# Patient Record
Sex: Female | Born: 1995 | Race: White | Hispanic: No | Marital: Single | Smoking: Never smoker
Health system: Southern US, Community
[De-identification: ages and names within clinical notes are randomized; demographics above are authoritative.]

## PROBLEM LIST (undated history)

## (undated) ENCOUNTER — Ambulatory Visit (HOSPITAL_COMMUNITY): Admission: EM | Payer: Self-pay | Source: Home / Self Care

## (undated) DIAGNOSIS — F32A Depression, unspecified: Secondary | ICD-10-CM

## (undated) DIAGNOSIS — E669 Obesity, unspecified: Secondary | ICD-10-CM

## (undated) DIAGNOSIS — F419 Anxiety disorder, unspecified: Secondary | ICD-10-CM

## (undated) DIAGNOSIS — T7840XA Allergy, unspecified, initial encounter: Secondary | ICD-10-CM

## (undated) DIAGNOSIS — K219 Gastro-esophageal reflux disease without esophagitis: Secondary | ICD-10-CM

## (undated) DIAGNOSIS — F329 Major depressive disorder, single episode, unspecified: Secondary | ICD-10-CM

## (undated) DIAGNOSIS — E282 Polycystic ovarian syndrome: Secondary | ICD-10-CM

## (undated) HISTORY — DX: Gastro-esophageal reflux disease without esophagitis: K21.9

## (undated) HISTORY — DX: Obesity, unspecified: E66.9

## (undated) HISTORY — DX: Anxiety disorder, unspecified: F41.9

## (undated) HISTORY — DX: Allergy, unspecified, initial encounter: T78.40XA

## (undated) HISTORY — DX: Polycystic ovarian syndrome: E28.2

## (undated) HISTORY — DX: Major depressive disorder, single episode, unspecified: F32.9

## (undated) HISTORY — DX: Depression, unspecified: F32.A

---
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
## 2013-01-26 ENCOUNTER — Other Ambulatory Visit (HOSPITAL_COMMUNITY): Payer: Self-pay | Admitting: Physician Assistant

## 2013-03-01 IMAGING — US US ABDOMEN COMPLETE
1 series · 13 of 25 positions shown · non-contrast
Comparison: None.

CLINICAL DATA: Abdominal pain for a few weeks.  Diabetes

COMPLETE ABDOMINAL ULTRASOUND

[Series 1: us abdomen complete · 0.26mm/px · 13 of 58 slices shown]
[im 1/58]
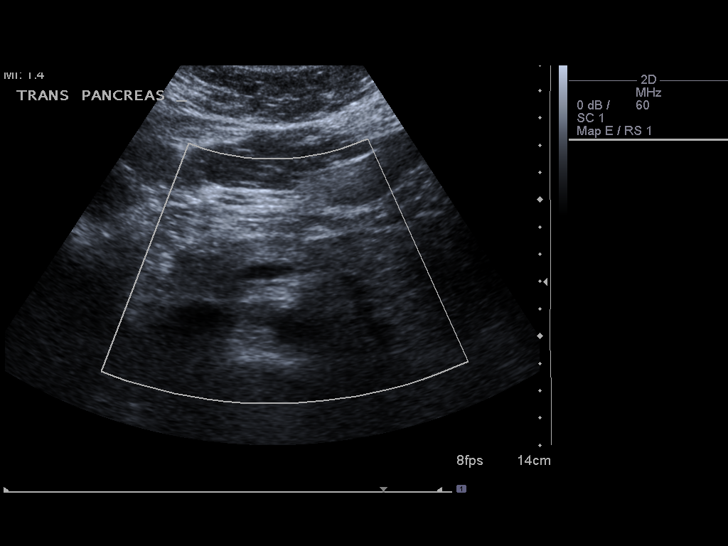
[im 5/58]
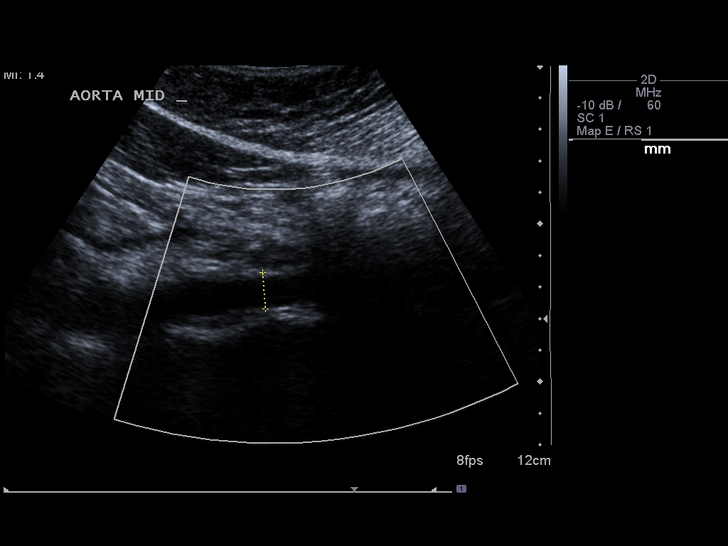
[im 10/58]
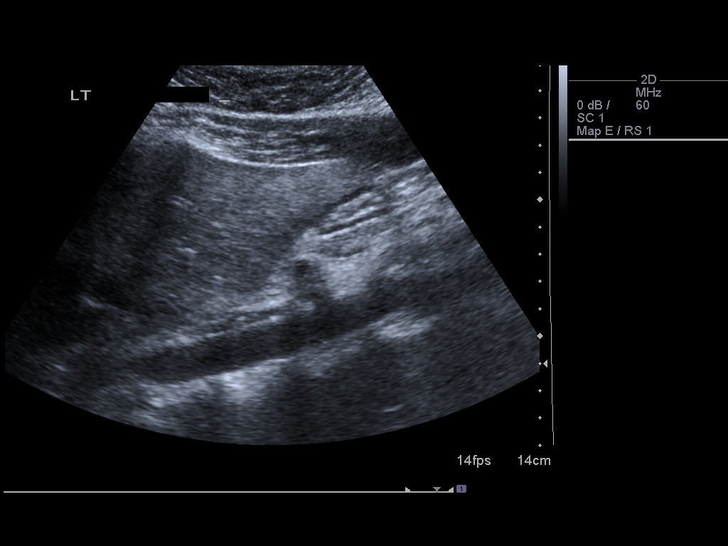
[im 15/58]
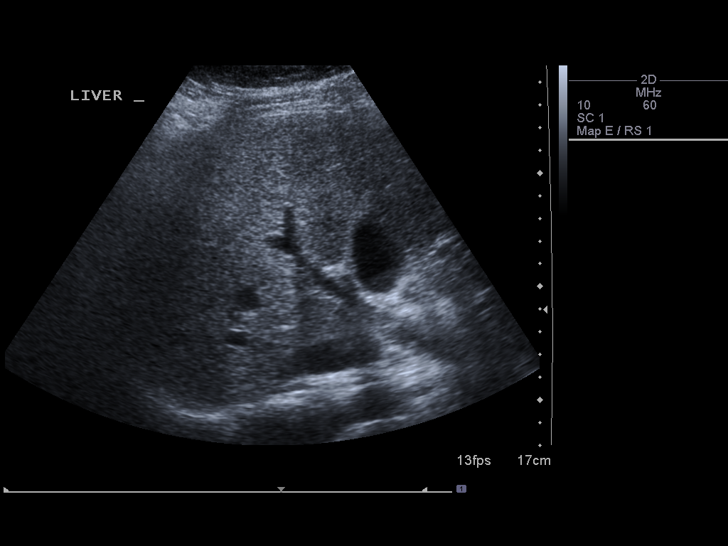
[im 20/58]
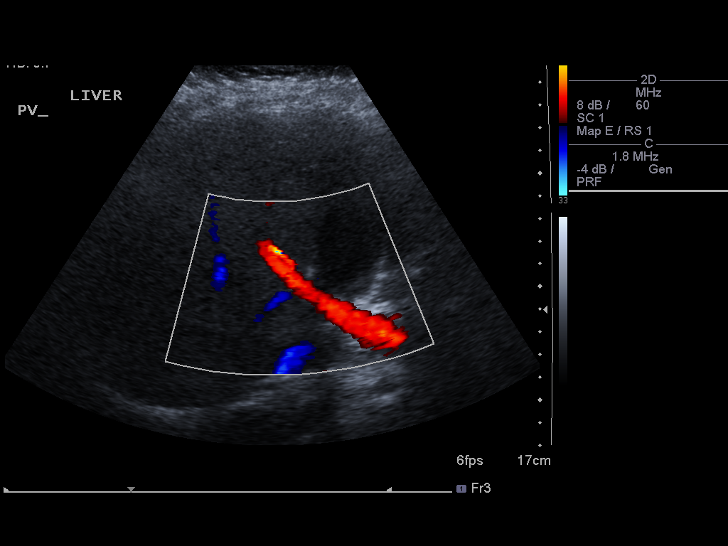
[im 24/58]
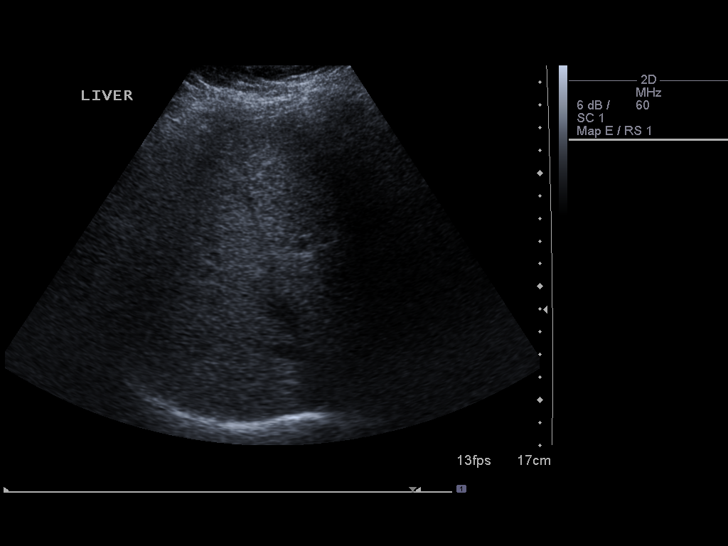
[im 29/58]
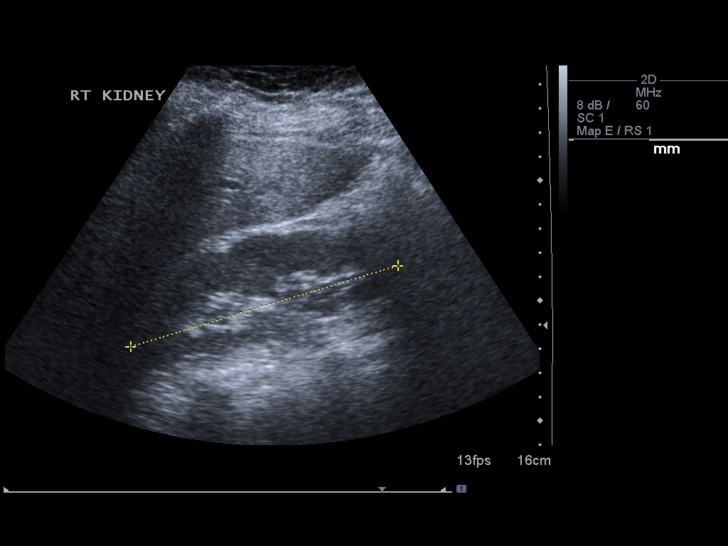
[im 34/58]
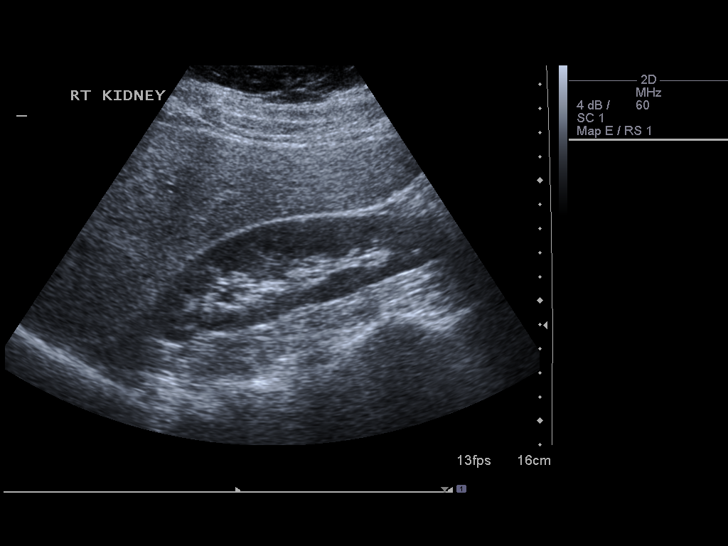
[im 39/58]
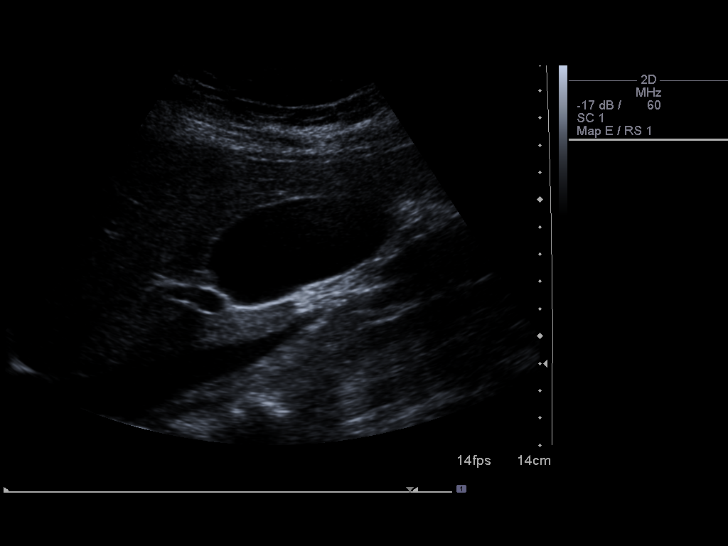
[im 43/58]
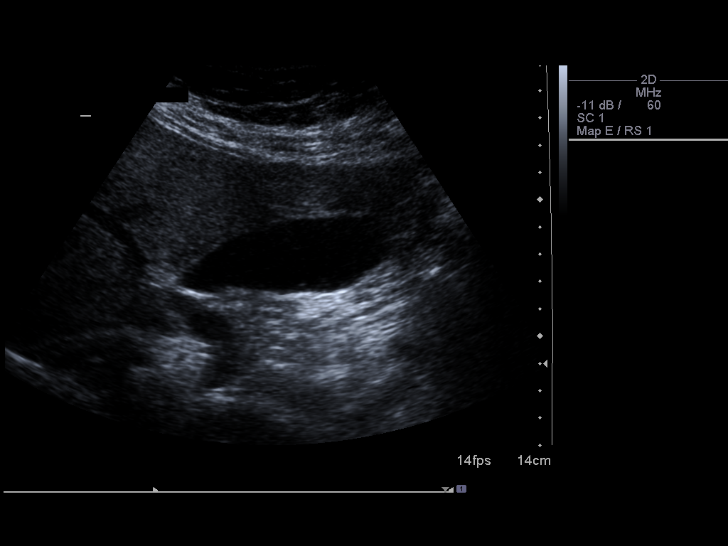
[im 48/58]
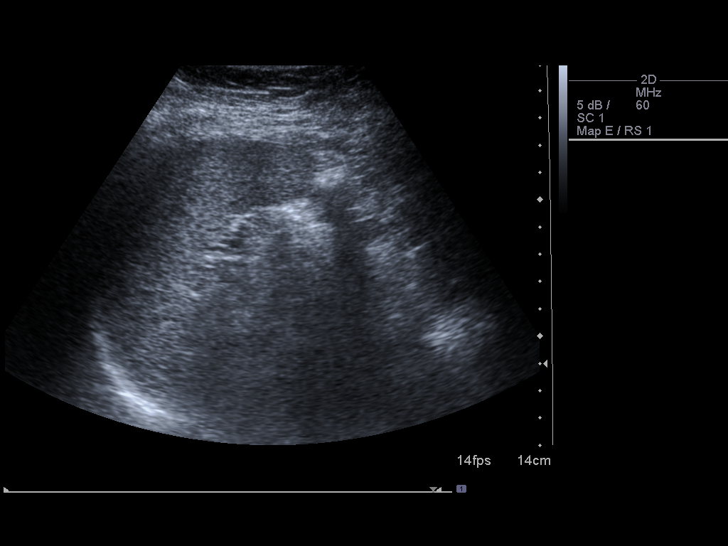
[im 53/58]
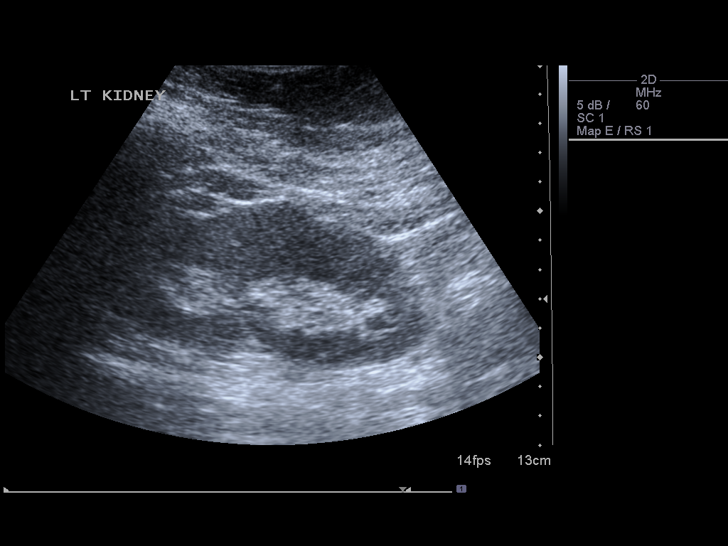
[im 58/58]
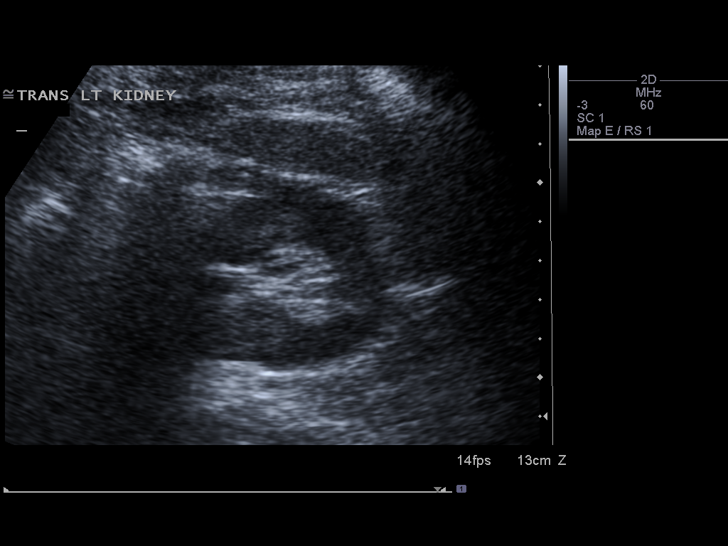

[13 of 25 positions shown; findings below may reference images not displayed]

FINDINGS: Gallbladder:  No gallstones, gallbladder wall thickening, or
pericholecystic fluid. Evaluation for sonographic Murphy's sign is
negative

Common bile duct:  Measures 1.9 mm in diameter and has a normal
appearance

Liver:  An overall mild increase in echotexture is seen with no
underlying focal abnormality and the appearance suggests underlying
mild diffuse hepatic steatosis.  No signs of intrahepatic ductal
dilatation are identified

IVC:  The proximal portion appears normal

Pancreas:  Is normal in size and echotexture

Spleen:  Has a sagittal length of 6.1 cm with no focal parenchymal
abnormality apparent

Right Kidney:  Demonstrates a sagittal length of 12.8 cm.  No focal
parenchymal abnormality or signs of hydronephrosis are seen

Left Kidney:  Demonstrates a sagittal length of 11.8 cm.  No focal
parenchymal abnormality or signs of hydronephrosis are seen

Abdominal aorta:  Has a maximal caliber of 1.6 cm with no
aneurysmal dilatation identified.
IMPRESSION: Question mild diffuse hepatic steatosis.  Otherwise unremarkable
abdominal ultrasound.

## 2013-03-24 ENCOUNTER — Ambulatory Visit (INDEPENDENT_AMBULATORY_CARE_PROVIDER_SITE_OTHER): Payer: 59 | Admitting: Psychiatry

## 2013-03-24 ENCOUNTER — Encounter (HOSPITAL_COMMUNITY): Payer: Self-pay | Admitting: Psychiatry

## 2013-03-24 VITALS — BP 148/81 | HR 82 | Ht 65.0 in | Wt 185.0 lb

## 2013-03-24 DIAGNOSIS — Z79899 Other long term (current) drug therapy: Secondary | ICD-10-CM

## 2013-03-24 DIAGNOSIS — F331 Major depressive disorder, recurrent, moderate: Secondary | ICD-10-CM

## 2013-03-24 MED ORDER — ESCITALOPRAM OXALATE 10 MG PO TABS: ORAL_TABLET | ORAL | Status: DC

## 2013-03-24 MED ORDER — BUPROPION HCL ER (XL) 300 MG PO TB24: ORAL_TABLET | ORAL | Status: DC
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
## 2013-03-26 ENCOUNTER — Other Ambulatory Visit: Payer: Self-pay | Admitting: Family Medicine

## 2013-05-24 ENCOUNTER — Other Ambulatory Visit: Payer: Self-pay | Admitting: Family Medicine

## 2013-05-30 ENCOUNTER — Other Ambulatory Visit (HOSPITAL_COMMUNITY): Payer: Self-pay | Admitting: Psychiatry

## 2013-05-30 NOTE — Telephone Encounter (Signed)
Given on 05/25/13. Too soon to refill.

## 2013-08-26 ENCOUNTER — Other Ambulatory Visit: Payer: Self-pay | Admitting: Family Medicine

## 2013-09-29 ENCOUNTER — Telehealth (HOSPITAL_COMMUNITY): Payer: Self-pay | Admitting: *Deleted

## 2013-09-29 NOTE — Telephone Encounter (Signed)
Patient left ZO:[id number]:Does not have enough medicine to last until appt on 10/19/13. Needs refills to last until then

## 2013-09-30 ENCOUNTER — Ambulatory Visit (HOSPITAL_COMMUNITY): Payer: Self-pay | Admitting: Psychiatry

## 2013-09-30 ENCOUNTER — Other Ambulatory Visit (HOSPITAL_COMMUNITY): Payer: Self-pay | Admitting: Psychiatry

## 2013-09-30 ENCOUNTER — Telehealth (HOSPITAL_COMMUNITY): Payer: Self-pay | Admitting: *Deleted

## 2013-09-30 DIAGNOSIS — F331 Major depressive disorder, recurrent, moderate: Secondary | ICD-10-CM

## 2013-09-30 MED ORDER — BUPROPION HCL ER (XL) 300 MG PO TB24: ORAL_TABLET | ORAL | Status: DC

## 2013-09-30 MED ORDER — ESCITALOPRAM OXALATE 10 MG PO TABS: ORAL_TABLET | ORAL | Status: DC

## 2013-09-30 NOTE — Telephone Encounter (Signed)
Message copied by Tonny BollmanKNISLEY, SANDRA M on Wed Sep 30, 2013 11:02 AM ------      Message from: Kathryne SharperARFEEN, SYED T      Created: Wed Sep 30, 2013  8:28 AM       Not seen since December.       She should seen before she ran out her medications.  ------

## 2013-09-30 NOTE — Progress Notes (Signed)
Patient is requesting prescription however she has not seen since December.  She is scheduled to see this Clinical research associatewriter in July.  We will authorize 30 day prescription for the local pharmacy.  Patient is also interested to see nurse practitioner.  I will discuss when she comes on her appointment on July 13.

## 2013-09-30 NOTE — Telephone Encounter (Signed)
Advised pt per Dr. Lolly MustacheArfeen that she needs appt before medication can be filled. Advised her MD may have some openings today if she is flexible

## 2013-10-07 ENCOUNTER — Other Ambulatory Visit: Payer: Self-pay | Admitting: Family Medicine

## 2013-10-19 ENCOUNTER — Encounter (HOSPITAL_COMMUNITY): Payer: Self-pay | Admitting: Psychiatry

## 2013-10-19 ENCOUNTER — Ambulatory Visit (INDEPENDENT_AMBULATORY_CARE_PROVIDER_SITE_OTHER): Payer: 59 | Admitting: Psychiatry

## 2013-10-19 VITALS — BP 134/86 | HR 100 | Ht 65.0 in | Wt 209.4 lb

## 2013-10-19 DIAGNOSIS — Z79899 Other long term (current) drug therapy: Secondary | ICD-10-CM

## 2013-10-19 DIAGNOSIS — F331 Major depressive disorder, recurrent, moderate: Secondary | ICD-10-CM

## 2013-10-19 MED ORDER — ESCITALOPRAM OXALATE 10 MG PO TABS: ORAL_TABLET | ORAL | Status: DC

## 2013-10-19 MED ORDER — BUPROPION HCL ER (XL) 300 MG PO TB24: ORAL_TABLET | ORAL | Status: DC
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
## 2013-10-21 LAB — COMPREHENSIVE METABOLIC PANEL
ALT: 20 U/L (ref 0–35)
AST: 14 U/L (ref 0–37)
Albumin: 4.1 g/dL (ref 3.5–5.2)
Alkaline Phosphatase: 64 U/L (ref 39–117)
BUN: 11 mg/dL (ref 6–23)
CALCIUM: 8.9 mg/dL (ref 8.4–10.5)
CO2: 25 mEq/L (ref 19–32)
CREATININE: 0.76 mg/dL (ref 0.50–1.10)
Chloride: 105 mEq/L (ref 96–112)
Glucose, Bld: 93 mg/dL (ref 70–99)
Potassium: 4 mEq/L (ref 3.5–5.3)
Sodium: 138 mEq/L (ref 135–145)
Total Bilirubin: 0.3 mg/dL (ref 0.2–1.2)
Total Protein: 6.5 g/dL (ref 6.0–8.3)

## 2013-10-21 LAB — CBC WITH DIFFERENTIAL/PLATELET
BASOS ABS: 0 10*3/uL (ref 0.0–0.1)
Basophils Relative: 0 % (ref 0–1)
EOS ABS: 0.2 10*3/uL (ref 0.0–0.7)
EOS PCT: 2 % (ref 0–5)
HEMATOCRIT: 37.6 % (ref 36.0–46.0)
Hemoglobin: 13 g/dL (ref 12.0–15.0)
Lymphocytes Relative: 38 % (ref 12–46)
Lymphs Abs: 3.4 10*3/uL (ref 0.7–4.0)
MCH: 25.2 pg — AB (ref 26.0–34.0)
MCHC: 34.6 g/dL (ref 30.0–36.0)
MCV: 73 fL — ABNORMAL LOW (ref 78.0–100.0)
Monocytes Absolute: 0.4 10*3/uL (ref 0.1–1.0)
Monocytes Relative: 4 % (ref 3–12)
Neutro Abs: 5 10*3/uL (ref 1.7–7.7)
Neutrophils Relative %: 56 % (ref 43–77)
PLATELETS: 384 10*3/uL (ref 150–400)
RBC: 5.15 MIL/uL — ABNORMAL HIGH (ref 3.87–5.11)
RDW: 14.6 % (ref 11.5–15.5)
WBC: 9 10*3/uL (ref 4.0–10.5)

## 2013-10-21 LAB — HEMOGLOBIN A1C
HEMOGLOBIN A1C: 5.6 % (ref <5.7)
Mean Plasma Glucose: 114 mg/dL (ref <117)

## 2013-10-22 LAB — TSH: TSH: 0.686 u[IU]/mL (ref 0.350–4.500)

## 2013-10-26 ENCOUNTER — Ambulatory Visit (INDEPENDENT_AMBULATORY_CARE_PROVIDER_SITE_OTHER): Payer: 59 | Admitting: Psychiatry

## 2013-10-26 ENCOUNTER — Encounter (HOSPITAL_COMMUNITY): Payer: Self-pay | Admitting: Psychiatry

## 2013-10-26 VITALS — BP 137/88 | HR 107 | Ht 64.75 in | Wt 208.6 lb

## 2013-10-26 DIAGNOSIS — F331 Major depressive disorder, recurrent, moderate: Secondary | ICD-10-CM

## 2013-10-26 MED ORDER — BUPROPION HCL ER (XL) 300 MG PO TB24: ORAL_TABLET | ORAL | Status: DC

## 2013-10-26 MED ORDER — ESCITALOPRAM OXALATE 10 MG PO TABS: ORAL_TABLET | ORAL | Status: DC

## 2013-10-26 MED ORDER — ESCITALOPRAM OXALATE 10 MG PO TABS
ORAL_TABLET | ORAL | Status: DC
Start: 2013-10-26 — End: 2013-10-26

## 2013-10-26 NOTE — Progress Notes (Addendum)
   [facility] Follow-up Outpatient Visit  Julia Golden Apr 08, 1996  Date:   10/26/13 Subjective:  This is my first meeting with her. She transferred from Dr. Lolly MustacheArfeen, was seen last week by him. Pt is inconsistent with treatment and becomes non compliant.Sleeping is good, but weird dreams; appetite is good. Mood is still depressed. She reports she is depressed 5/10, anxiety 4/10. She denies SI/HI/AVH. Tolerating the medication. She is moving to [place], around the 15th of August, so will give her a list of providers. A list of providers was mailed to her, per staff. Pt is not getting a long with mother. Pt was asking for medication for ADHD;  will hold off until she moves to [place]. Encouraged to bring psychological testing to the next MD visit. Went over her labs and answered any questions she may have.   Filed Vitals:   10/26/13 1328  BP: 137/88  Pulse: 107    Mental Status Examination  Appearance: casual  Alert: Yes Attention: fair  Cooperative: Yes Eye Contact: Fair Speech: wdl  Psychomotor Activity: Normal Memory/Concentration: fair  Oriented: time/date and day of week Mood: Anxious and Dysphoric Affect: Constricted and Depressed Thought Processes and Associations: Coherent Fund of Knowledge: Fair Thought Content: preoccupatuions Insight: Fair Judgement: Fair  Diagnosis:  GAD ADHD MDD,recurrent, moderate Treatment Plan:  Bupropion XL 300 mg for depression lexapro 10 mg po for anxiety  Kendrick FriesBLANKMANN, Emsley Custer, NP

## 2013-10-27 ENCOUNTER — Telehealth (HOSPITAL_COMMUNITY): Payer: Self-pay

## 2013-11-05 ENCOUNTER — Telehealth (HOSPITAL_COMMUNITY): Payer: Self-pay | Admitting: *Deleted

## 2013-11-05 NOTE — Telephone Encounter (Signed)
Mother left VM:Has 2 concerns-1)saw Dr. Lolly MustacheArfeen 7/13- did not go well,they weren't happy.Minerva Areola.Eric arranged for her to see Meg on 7/20-went well.Does not think she should have to pay for first visit since not happy.2) Also having problems getting meds filled w/[facility]. Notified Dealerric Foushee,Practice Manager for [employer] [facility] of mother's concerns r/t bill for visits. Minerva Areolaric stated she was welcome to contact him and gave his cell #. Contacted mother.Informed her of Mr. Dareen PianoFoushee's offer-she stated she had talked to him and was referred to another staff member, Marylene Landngela for assistance.Stated she had left Marylene Landngela a message but not heard from her.Informed her another message will be given to Marylene Landngela to contact her. Inquired as to problem with [facility], she stated she had received a letter from them with problems related to filling RX. Informed her office had not received any notification.she asked for office fax# and states she will send it.

## 2013-12-13 ENCOUNTER — Other Ambulatory Visit: Payer: Self-pay | Admitting: Family Medicine

## 2014-09-11 ENCOUNTER — Ambulatory Visit (INDEPENDENT_AMBULATORY_CARE_PROVIDER_SITE_OTHER): Payer: Managed Care, Other (non HMO) | Admitting: Emergency Medicine

## 2014-09-11 VITALS — BP 110/90 | HR 90 | Temp 98.3°F | Ht 65.5 in | Wt 236.0 lb

## 2014-09-11 DIAGNOSIS — Z202 Contact with and (suspected) exposure to infections with a predominantly sexual mode of transmission: Secondary | ICD-10-CM

## 2014-09-11 DIAGNOSIS — Z3009 Encounter for other general counseling and advice on contraception: Secondary | ICD-10-CM | POA: Diagnosis not present

## 2014-09-11 DIAGNOSIS — E282 Polycystic ovarian syndrome: Secondary | ICD-10-CM | POA: Diagnosis not present

## 2014-09-11 LAB — POCT URINE PREGNANCY: Preg Test, Ur: NEGATIVE

## 2014-09-11 MED ORDER — DROSPIRENONE-ETHINYL ESTRADIOL 3-0.02 MG PO TABS: 1.0000 | ORAL_TABLET | Freq: Every day | ORAL | Status: DC

## 2014-09-11 NOTE — Progress Notes (Signed)
   Subjective:  This chart was scribed for Earl LitesSteve Daub, MD by [facility] Center[facility] Abu Hashem, medical scribe at [facility].The patient was seen in exam room 14 and the patient's care was started at 11:45 AM.   Patient ID: Julia MeadGrace Cothron, female    DOB: 1995-09-21, 19 y.o.   MRN: [id number] Chief Complaint  Patient presents with  . Annual Exam    CPE  . Depression    per triage screening (father recently passed away)-currently on medication   HPI HPI Comments: Julia MeadGrace Alridge is a 19 y.o. female who presents to [facility] complaining of  Birth control: Getting birth control from her health center at school and they would not refill her birth control. No PAP smear. She has gotten her HPV vaccine. Menstrual periods are normal, no chest pain, no calf pain.  Depression:  She has been on Lexapro, and Wellbutrin for over a year, this has helped with her recent passing of her father. She went to counseling once, she thinks she is fine alone. Going back to [employer] [place] classes tomorrow.  Review of Systems  Cardiovascular: Negative for chest pain and leg swelling.  Genitourinary: Negative for menstrual problem.      Objective:  BP 110/90 mmHg  Pulse 90  Temp(Src) 98.3 F (36.8 C) (Oral)  Ht 5' 5.5" (1.664 m)  Wt 236 lb (107.049 kg)  BMI 38.66 kg/m2  SpO2 99%  LMP 07/12/2014 (Approximate) Physical Exam  Vitals reviewed. CONSTITUTIONAL: Well developed/well nourished HEAD: Normocephalic/atraumatic EYES: EOMI/PERRL ENMT: Mucous membranes moist NECK: supple no meningeal signs SPINE/BACK:entire spine nontender CV: S1/S2 noted, no murmurs/rubs/gallops noted LUNGS: Lungs are clear to auscultation bilaterally, no apparent distress ABDOMEN: soft, nontender, no rebound or guarding, bowel sounds noted throughout abdomen GU:no cva tenderness NEURO: Pt is awake/alert/appropriate, moves all extremitiesx4.  No facial droop.   EXTREMITIES: pulses normal/equal,  full ROM SKIN: warm, color normal PSYCH: no abnormalities of mood noted, alert and oriented to situation Results for orders placed or performed in visit on 09/11/14  POCT urine pregnancy  Result Value Ref Range   Preg Test, Ur Negative       Assessment & Plan:   Birth control pills were refilled. She will continue her anti-presence. I advised her to consider counseling when she returns to school. I also advised her to have her regular Pap smear when she returns to school. I did do a URiprobe today. She has had her gardisil

## 2014-09-14 LAB — GC/CHLAMYDIA PROBE AMP
CT PROBE, AMP APTIMA: NEGATIVE
GC PROBE AMP APTIMA: NEGATIVE

## 2014-10-07 ENCOUNTER — Other Ambulatory Visit: Payer: Self-pay

## 2014-10-07 MED ORDER — DROSPIRENONE-ETHINYL ESTRADIOL 3-0.02 MG PO TABS: 1.0000 | ORAL_TABLET | Freq: Every day | ORAL | Status: DC

## 2015-01-11 ENCOUNTER — Encounter: Payer: Self-pay | Admitting: Emergency Medicine

## 2015-06-03 ENCOUNTER — Other Ambulatory Visit: Payer: Self-pay

## 2015-06-03 MED ORDER — DROSPIRENONE-ETHINYL ESTRADIOL 3-0.02 MG PO TABS: 1.0000 | ORAL_TABLET | Freq: Every day | ORAL | Status: AC

## 2015-11-09 ENCOUNTER — Ambulatory Visit (INDEPENDENT_AMBULATORY_CARE_PROVIDER_SITE_OTHER): Payer: Managed Care, Other (non HMO) | Admitting: Physician Assistant

## 2015-11-09 VITALS — BP 138/90 | HR 93 | Temp 98.3°F | Resp 20 | Ht 65.5 in | Wt 237.0 lb

## 2015-11-09 DIAGNOSIS — S61012A Laceration without foreign body of left thumb without damage to nail, initial encounter: Secondary | ICD-10-CM

## 2015-11-09 DIAGNOSIS — Z23 Encounter for immunization: Secondary | ICD-10-CM | POA: Diagnosis not present

## 2015-11-09 DIAGNOSIS — M79645 Pain in left finger(s): Secondary | ICD-10-CM

## 2015-11-09 DIAGNOSIS — IMO0002 Reserved for concepts with insufficient information to code with codable children: Secondary | ICD-10-CM

## 2015-11-09 NOTE — Progress Notes (Signed)
   Julia Golden  MRN: [id number] DOB: [birth year]  Subjective:  Julia Golden is a 20 y.o. female seen in office today for a chief complaint of wound to left hand one hour ago. Pt was cutting open a box with a clean knife when the knife slipped and cut her left hand at the base of the thumb. Has associated pain and tingling. Denies numbness, loss of sensation, and decreased ROM.   Pt does not know when she last had a tetanus vaccine.   Review of Systems  All other systems reviewed and are negative.   Patient Active Problem List   Diagnosis Date Noted  . Generalized anxiety disorder 08/21/2012  . Major depressive disorder, recurrent episode, moderate (HCC) 06/19/2012  . 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 drospirenone-ethinyl estradiol (GIANVI) 3-0.02 MG tablet Take 1 tablet by mouth daily. 84 tablet 1   No current facility-administered medications on file prior to visit.     No Known Allergies  Objective:  BP 138/90 (BP Location: Right Arm, Patient Position: Sitting, Cuff Size: Normal)   Pulse 93   Temp 98.3 F (36.8 C) (Oral)   Resp 20   Ht 5' 5.5" (1.664 m)   Wt 237 lb (107.5 kg)   SpO2 98%   BMI 38.84 kg/m   Physical Exam  Constitutional: She is oriented to person, place, and time and well-developed, well-nourished, and in no distress.  HENT:  Head: Normocephalic and atraumatic.  Eyes: Conjunctivae are normal.  Neck: Normal range of motion.  Pulmonary/Chest: Effort normal.  Neurological: She is alert and oriented to person, place, and time. Gait normal.  Skin: Skin is warm and dry. Laceration (1 cm laceration to base of left thumb on palmar surface) noted.  Psychiatric: Affect normal.  Vitals reviewed.  PROCEDURE NOTE: laceration repair Verbal consent obtained from patient.  Local anesthesia with 3cc Lidocaine 1% without epinephrine.   Wound explored for tendon, ligament damage. Wound scrubbed with soap and water and rinsed. Wound closed with #1 5-0 Ethilon horizontal suture sutures.  Wound cleansed and dressed.  Benjiman Core, PA-C  [facility] 11/09/2015 6:24 PM   Assessment and Plan :   1. Laceration -Wound care instructions given -Return to clinic in 10 days for suture removal - Tdap vaccine greater than or equal to 7yo IM  2. Need for Tdap vaccination - Tdap vaccine greater than or equal to 7yo IM    Benjiman Core PA-C  [facility] 11/09/2015 5:03 PM

## 2015-11-09 NOTE — Patient Instructions (Addendum)
  WOUND CARE Please return in 10 days to have your stitches/staples removed or sooner if you have concerns. Marland Kitchen Keep area clean and dry for 24 hours. Do not remove bandage, if applied. . After 24 hours, remove bandage and wash wound gently with mild soap and warm water. Reapply a new bandage after cleaning wound, if directed. . Continue daily cleansing with soap and water until stitches/staples are removed. . Do not apply any ointments or creams to the wound while stitches/staples are in place, as this may cause delayed healing. . Notify the office if you experience any of the following signs of infection: Swelling, redness, pus drainage, streaking, fever >101.0 F . Notify the office if you experience excessive bleeding that does not stop after 15-20 minutes of constant, firm pressure.     IF you received an x-ray today, you will receive an invoice from [facility]. Please contact [facility] at [phone] with questions or concerns regarding your invoice.   IF you received labwork today, you will receive an invoice from [employer] [facility]. Please contact [facility] at [phone] with questions or concerns regarding your invoice.   Our billing staff will not be able to assist you with questions regarding bills from these companies.  You will be contacted with the lab results as soon as they are available. The fastest way to get your results is to activate your My Chart account. Instructions are located on the last page of this paperwork. If you have not heard from [place] regarding the results in 2 weeks, please contact this office.     wou   IF you received an x-ray today, you will receive an invoice from [facility]. Please contact [facility] at [phone] with questions or concerns regarding your invoice.   IF you received labwork today, you will receive an invoice from [employer]. Please contact  [facility] at [phone] with questions or concerns regarding your invoice.   Our billing staff will not be able to assist you with questions regarding bills from these companies.  You will be contacted with the lab results as soon as they are available. The fastest way to get your results is to activate your My Chart account. Instructions are located on the last page of this paperwork. If you have not heard from [place] regarding the results in 2 weeks, please contact this office.

## 2016-12-07 ENCOUNTER — Encounter: Payer: Self-pay | Admitting: Physician Assistant

## 2016-12-07 ENCOUNTER — Ambulatory Visit (INDEPENDENT_AMBULATORY_CARE_PROVIDER_SITE_OTHER): Payer: 59 | Admitting: Physician Assistant

## 2016-12-07 VITALS — BP 128/79 | HR 81 | Temp 99.0°F | Resp 17 | Ht 65.5 in | Wt 226.0 lb

## 2016-12-07 DIAGNOSIS — Z5321 Procedure and treatment not carried out due to patient leaving prior to being seen by health care provider: Secondary | ICD-10-CM

## 2016-12-07 NOTE — Patient Instructions (Signed)
     IF you received an x-ray today, you will receive an invoice from [facility]. Please contact [facility] at [phone] with questions or concerns regarding your invoice.   IF you received labwork today, you will receive an invoice from [facility]. Please contact [facility] at [phone] with questions or concerns regarding your invoice.   Our billing staff will not be able to assist you with questions regarding bills from these companies.  You will be contacted with the lab results as soon as they are available. The fastest way to get your results is to activate your My Chart account. Instructions are located on the last page of this paperwork. If you have not heard from us regarding the results in 2 weeks, please contact this office.     

## 2016-12-12 NOTE — Progress Notes (Signed)
I did not personally examine this patient.  

## 2017-12-17 ENCOUNTER — Encounter: Payer: Self-pay | Admitting: Obstetrics and Gynecology

## 2021-03-06 ENCOUNTER — Other Ambulatory Visit: Payer: Self-pay

## 2021-03-08 ENCOUNTER — Other Ambulatory Visit: Payer: Self-pay

## 2021-03-08 ENCOUNTER — Encounter (HOSPITAL_COMMUNITY): Payer: Self-pay

## 2021-03-08 ENCOUNTER — Ambulatory Visit (HOSPITAL_COMMUNITY)
Admission: EM | Admit: 2021-03-08 | Discharge: 2021-03-08 | Disposition: A | Discharge status: Home / Self Care | Payer: 59

## 2021-03-08 VITALS — BP 126/84 | HR 86 | Temp 98.6°F | Resp 18

## 2021-03-08 DIAGNOSIS — S8011XA Contusion of right lower leg, initial encounter: Secondary | ICD-10-CM | POA: Diagnosis not present

## 2021-03-08 DIAGNOSIS — S8012XA Contusion of left lower leg, initial encounter: Secondary | ICD-10-CM

## 2021-03-08 NOTE — ED Triage Notes (Signed)
Pt reports that fell backwards off a bar stool due to peg missing off bottom of leg when leaned back and bilat lower legs hit the counter and still having pains .

## 2021-03-08 NOTE — ED Provider Notes (Signed)
[facility]    CSN: [id number] Arrival date & time: 03/08/21  0901      History   Chief Complaint Chief Complaint  Patient presents with   appt 900   Leg Pain    HPI Julia Golden is a 25 y.o. female.  8 days ago, patient was out of the lower and fell backwards off of it.  Her bilateral lower legs hit the counter causing pain.  She still has significant pain, swelling, and bruising.  She is worried she might have broken something.  She still able to bear weight and walk like normal.  She stands on her feet all day long for work, and by the end of the day her feet are more swollen and the area of injury is more painful.  Symptoms are less severe in the morning.  Also reports she broke her elbow a year ago and is interested in some physical therapy follow-up.    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 No pertinent surgical history.  OB History   No obstetric history on file.      Home Medications    Prior to Admission medications   Medication Sig Start Date End Date Taking? Authorizing Provider  ALPRAZolam Prudy Feeler) 0.5 MG tablet Take 0.5 mg by mouth as needed.    [provider]  drospirenone-ethinyl estradiol (GIANVI) 3-0.02 MG tablet Take 1 tablet by mouth daily. 06/03/15   Collene Gobble, MD  prazosin (MINIPRESS) 1 MG capsule Take 1 capsule by mouth at bedtime.    [provider]  Sertraline HCl (ZOLOFT PO) Take by mouth.    [provider]    Family History Family History  Problem Relation Age of Onset    Depression Father    Bipolar disorder Mother        Pt believes, but does not know if formally dx   Alcohol abuse Mother    ADD / ADHD Sister    Cancer Maternal Grandmother    Cancer Paternal Grandmother     Social History Social History   Tobacco Use   Smoking status: Never   Smokeless tobacco: Never  Substance Use Topics   Alcohol use: Yes    Alcohol/week: 0.0 standard drinks    Comment: Monthly, never intoxicated   Drug use: No    Comment: Tried THC once last year     Allergies   Patient has no known allergies.   Review of Systems Review of Systems  Musculoskeletal:  Negative for gait problem.       Swelling and bruising to BLE  Skin:  Positive for color change and wound.    Physical Exam Triage Vital Signs ED Triage Vitals  Enc Vitals Group     BP 03/08/21 0928 126/84     Pulse Rate 03/08/21 0928 86     Resp 03/08/21 0928 18     Temp 03/08/21 0928 98.6 F (37 C)     Temp Source 03/08/21 0928 Oral     SpO2 03/08/21 0928  98 %     Weight --      Height --      Head Circumference --      Peak Flow --      Pain Score 03/08/21 0925 8     Pain Loc --      Pain Edu? --      Excl. in GC? --    No data found.  Updated Vital Signs BP 126/84 (BP Location: Right Arm)   Pulse 86   Temp 98.6 F (37 C) (Oral)   Resp 18   SpO2 98%   Visual Acuity Right Eye Distance:   Left Eye Distance:   Bilateral Distance:    Right Eye Near:   Left Eye Near:    Bilateral Near:     Physical Exam Constitutional:      General: She is not in acute distress.    Appearance: Normal appearance. She is obese. She is not ill-appearing.  Cardiovascular:     Pulses:          Dorsalis pedis pulses are 2+ on the right side and 2+ on the left side.       Posterior tibial pulses are 2+ on the right side and 2+ on the left side.  Pulmonary:     Effort: Pulmonary effort is normal.  Musculoskeletal:     Right lower leg: Tenderness present. Edema present.     Left lower leg:  Tenderness present. Edema present.       Legs:  Neurological:     Mental Status: She is alert.     Gait: Gait normal.     UC Treatments / Results  Labs (all labs ordered are listed, but only abnormal results are displayed) Labs Reviewed - No data to display  EKG   Radiology No results found.  Procedures Procedures (including critical care time)  Medications Ordered in UC Medications - No data to display  Initial Impression / Assessment and Plan / UC Course  I have reviewed the triage vital signs and the nursing notes.  Pertinent labs & imaging results that were available during my care of the patient were reviewed by me and considered in my medical decision making (see chart for details).    Reassured pt unlikely she has fractures given no issues bearing weight and significant hematomas BLE explain pain. Reviewed normal course of recovery, use of heat therapy.   Did not examine elbow - pt did not have elbow c/o but asked about referral to PT. I referred her to sports medicine.   Final Clinical Impressions(s) / UC Diagnoses   Final diagnoses:  Traumatic hematoma of left lower leg, initial encounter  Traumatic hematoma of right lower leg, initial encounter     Discharge Instructions      Continue to use warm compresses and/or warm baths with epsom salt in them several times a day. Also prop your lower legs up as you are able. Continue to use Advil per bottle instructions for pain.   Follow up with Sports Medicine about your prior elbow fracture.    ED Prescriptions   None    PDMP not reviewed this encounter.   Cathlyn Parsons, NP 03/08/21 [phone]

## 2021-03-08 NOTE — Discharge Instructions (Signed)
Continue to use warm compresses and/or warm baths with epsom salt in them several times a day. Also prop your lower legs up as you are able. Continue to use Advil per bottle instructions for pain.   Follow up with Sports Medicine about your prior elbow fracture.
